# Patient Record
Sex: Male | Born: 1970 | Race: White | Marital: Married
Health system: Southern US, Community
[De-identification: ages and names within clinical notes are randomized; demographics above are authoritative.]

---

## 2016-10-16 DIAGNOSIS — J028 Acute pharyngitis due to other specified organisms: Secondary | ICD-10-CM | POA: Diagnosis not present

## 2016-10-16 DIAGNOSIS — J029 Acute pharyngitis, unspecified: Secondary | ICD-10-CM | POA: Diagnosis not present

## 2016-11-15 DIAGNOSIS — Z0000 Encounter for general adult medical examination without abnormal findings: Secondary | ICD-10-CM | POA: Diagnosis not present

## 2016-11-15 DIAGNOSIS — E559 Vitamin D deficiency, unspecified: Secondary | ICD-10-CM | POA: Diagnosis not present

## 2016-11-15 DIAGNOSIS — M8580 Other specified disorders of bone density and structure, unspecified site: Secondary | ICD-10-CM | POA: Diagnosis not present

## 2016-11-15 DIAGNOSIS — Z125 Encounter for screening for malignant neoplasm of prostate: Secondary | ICD-10-CM | POA: Diagnosis not present

## 2016-11-15 DIAGNOSIS — E291 Testicular hypofunction: Secondary | ICD-10-CM | POA: Diagnosis not present

## 2016-11-15 DIAGNOSIS — E782 Mixed hyperlipidemia: Secondary | ICD-10-CM | POA: Diagnosis not present

## 2017-02-14 DIAGNOSIS — E291 Testicular hypofunction: Secondary | ICD-10-CM | POA: Diagnosis not present

## 2017-02-14 DIAGNOSIS — D225 Melanocytic nevi of trunk: Secondary | ICD-10-CM | POA: Diagnosis not present

## 2017-02-14 DIAGNOSIS — Z5181 Encounter for therapeutic drug level monitoring: Secondary | ICD-10-CM | POA: Diagnosis not present

## 2017-02-14 DIAGNOSIS — L814 Other melanin hyperpigmentation: Secondary | ICD-10-CM | POA: Diagnosis not present

## 2017-02-14 DIAGNOSIS — D1801 Hemangioma of skin and subcutaneous tissue: Secondary | ICD-10-CM | POA: Diagnosis not present

## 2017-02-14 DIAGNOSIS — R5383 Other fatigue: Secondary | ICD-10-CM | POA: Diagnosis not present

## 2017-02-16 DIAGNOSIS — R5383 Other fatigue: Secondary | ICD-10-CM | POA: Diagnosis not present

## 2017-02-16 DIAGNOSIS — Z5181 Encounter for therapeutic drug level monitoring: Secondary | ICD-10-CM | POA: Diagnosis not present

## 2017-02-16 DIAGNOSIS — Z23 Encounter for immunization: Secondary | ICD-10-CM | POA: Diagnosis not present

## 2017-02-16 DIAGNOSIS — E291 Testicular hypofunction: Secondary | ICD-10-CM | POA: Diagnosis not present

## 2017-02-24 ENCOUNTER — Other Ambulatory Visit: Payer: Self-pay | Admitting: Internal Medicine

## 2017-02-24 DIAGNOSIS — E230 Hypopituitarism: Secondary | ICD-10-CM

## 2017-03-06 ENCOUNTER — Other Ambulatory Visit: Payer: Self-pay

## 2017-03-09 ENCOUNTER — Ambulatory Visit
Admission: RE | Admit: 2017-03-09 | Discharge: 2017-03-09 | Disposition: A | Discharge status: Home / Self Care | Payer: 59 | Source: Ambulatory Visit | Attending: Internal Medicine | Admitting: Internal Medicine

## 2017-03-09 DIAGNOSIS — E230 Hypopituitarism: Secondary | ICD-10-CM

## 2017-03-09 MED ORDER — GADOBENATE DIMEGLUMINE 529 MG/ML IV SOLN
10.0000 mL | Freq: Once | INTRAVENOUS | Status: AC | PRN
  Administered 2017-03-09: 10 mL via INTRAVENOUS

## 2017-03-28 DIAGNOSIS — R5383 Other fatigue: Secondary | ICD-10-CM | POA: Diagnosis not present

## 2017-03-28 DIAGNOSIS — E230 Hypopituitarism: Secondary | ICD-10-CM | POA: Diagnosis not present

## 2017-03-28 DIAGNOSIS — Z5181 Encounter for therapeutic drug level monitoring: Secondary | ICD-10-CM | POA: Diagnosis not present

## 2017-04-28 DIAGNOSIS — H1851 Endothelial corneal dystrophy: Secondary | ICD-10-CM | POA: Diagnosis not present

## 2017-05-30 DIAGNOSIS — E782 Mixed hyperlipidemia: Secondary | ICD-10-CM | POA: Diagnosis not present

## 2017-05-30 DIAGNOSIS — E559 Vitamin D deficiency, unspecified: Secondary | ICD-10-CM | POA: Diagnosis not present

## 2017-05-30 DIAGNOSIS — J309 Allergic rhinitis, unspecified: Secondary | ICD-10-CM | POA: Diagnosis not present

## 2017-09-26 DIAGNOSIS — R5383 Other fatigue: Secondary | ICD-10-CM | POA: Diagnosis not present

## 2017-09-26 DIAGNOSIS — Z125 Encounter for screening for malignant neoplasm of prostate: Secondary | ICD-10-CM | POA: Diagnosis not present

## 2017-09-26 DIAGNOSIS — Z5181 Encounter for therapeutic drug level monitoring: Secondary | ICD-10-CM | POA: Diagnosis not present

## 2017-09-26 DIAGNOSIS — E230 Hypopituitarism: Secondary | ICD-10-CM | POA: Diagnosis not present

## 2017-11-29 DIAGNOSIS — E291 Testicular hypofunction: Secondary | ICD-10-CM | POA: Diagnosis not present

## 2017-11-29 DIAGNOSIS — E782 Mixed hyperlipidemia: Secondary | ICD-10-CM | POA: Diagnosis not present

## 2017-11-29 DIAGNOSIS — E559 Vitamin D deficiency, unspecified: Secondary | ICD-10-CM | POA: Diagnosis not present

## 2017-11-29 DIAGNOSIS — M85852 Other specified disorders of bone density and structure, left thigh: Secondary | ICD-10-CM | POA: Diagnosis not present

## 2018-01-11 DIAGNOSIS — M8588 Other specified disorders of bone density and structure, other site: Secondary | ICD-10-CM | POA: Diagnosis not present

## 2018-02-15 DIAGNOSIS — L570 Actinic keratosis: Secondary | ICD-10-CM | POA: Diagnosis not present

## 2018-02-15 DIAGNOSIS — D1801 Hemangioma of skin and subcutaneous tissue: Secondary | ICD-10-CM | POA: Diagnosis not present

## 2018-02-15 DIAGNOSIS — L814 Other melanin hyperpigmentation: Secondary | ICD-10-CM | POA: Diagnosis not present

## 2018-03-29 DIAGNOSIS — Z5181 Encounter for therapeutic drug level monitoring: Secondary | ICD-10-CM | POA: Diagnosis not present

## 2018-03-29 DIAGNOSIS — E230 Hypopituitarism: Secondary | ICD-10-CM | POA: Diagnosis not present

## 2018-03-29 DIAGNOSIS — E669 Obesity, unspecified: Secondary | ICD-10-CM | POA: Diagnosis not present

## 2018-05-01 DIAGNOSIS — H1851 Endothelial corneal dystrophy: Secondary | ICD-10-CM | POA: Diagnosis not present

## 2018-06-05 DIAGNOSIS — E559 Vitamin D deficiency, unspecified: Secondary | ICD-10-CM | POA: Diagnosis not present

## 2018-06-05 DIAGNOSIS — M85852 Other specified disorders of bone density and structure, left thigh: Secondary | ICD-10-CM | POA: Diagnosis not present

## 2018-06-05 DIAGNOSIS — E782 Mixed hyperlipidemia: Secondary | ICD-10-CM | POA: Diagnosis not present

## 2018-10-04 DIAGNOSIS — E230 Hypopituitarism: Secondary | ICD-10-CM | POA: Diagnosis not present

## 2018-10-04 DIAGNOSIS — Z119 Encounter for screening for infectious and parasitic diseases, unspecified: Secondary | ICD-10-CM | POA: Diagnosis not present

## 2018-10-04 DIAGNOSIS — Z13220 Encounter for screening for lipoid disorders: Secondary | ICD-10-CM | POA: Diagnosis not present

## 2018-10-11 DIAGNOSIS — E669 Obesity, unspecified: Secondary | ICD-10-CM | POA: Diagnosis not present

## 2018-10-11 DIAGNOSIS — Z5181 Encounter for therapeutic drug level monitoring: Secondary | ICD-10-CM | POA: Diagnosis not present

## 2018-10-11 DIAGNOSIS — E230 Hypopituitarism: Secondary | ICD-10-CM | POA: Diagnosis not present

## 2019-02-21 DIAGNOSIS — D225 Melanocytic nevi of trunk: Secondary | ICD-10-CM | POA: Diagnosis not present

## 2019-02-21 DIAGNOSIS — L814 Other melanin hyperpigmentation: Secondary | ICD-10-CM | POA: Diagnosis not present

## 2019-03-27 IMAGING — MR MR HEAD WO/W CM
24 series · 48 of 48 positions shown · IV contrast (Multihance 10ml)
Comparison: None.

CLINICAL DATA: Hypogonadotropic hypogonadism (HCC)
([id number])

EXAM:
MRI HEAD WITHOUT AND WITH CONTRAST
TECHNIQUE: Multiplanar, multiecho pulse sequences of the brain and surrounding
structures were obtained without and with intravenous contrast.
CONTRAST:  10mL MULTIHANCE GADOBENATE DIMEGLUMINE 529 MG/ML IV SOLN

[Series 5: T1 · sagittal · 4.0mm · 0.72mm/px · 1 of 27 slices shown (1 of 5)]
[im 1/27]
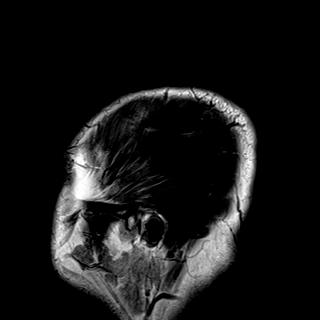

[Series 6: DWI · axial · 3.0mm · 1.44mm/px · z∈[-71,+89]mm · 6 of 84 slices shown (1 of 2)]
[im 1/84]
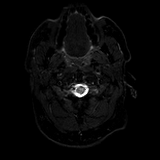
[im 17/84]
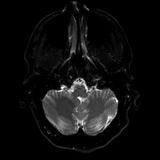
[im 34/84]
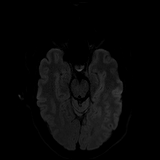
[im 50/84]
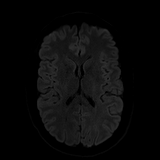
[im 67/84]
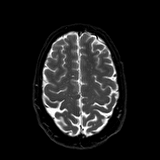
[im 84/84]
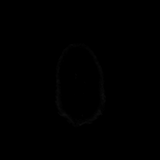

[Series 7: DWI · axial · 3.0mm · 1.44mm/px · z∈[-71,+89]mm · 2 of 42 slices shown (2 of 2)]
[im 1/42]
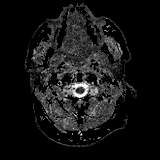
[im 42/42]
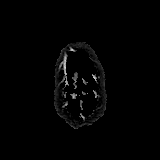

[Series 8: T2 · axial · 4.0mm · 0.36mm/px · z∈[-64,+92]mm · 2 of 31 slices shown]
[im 1/31]
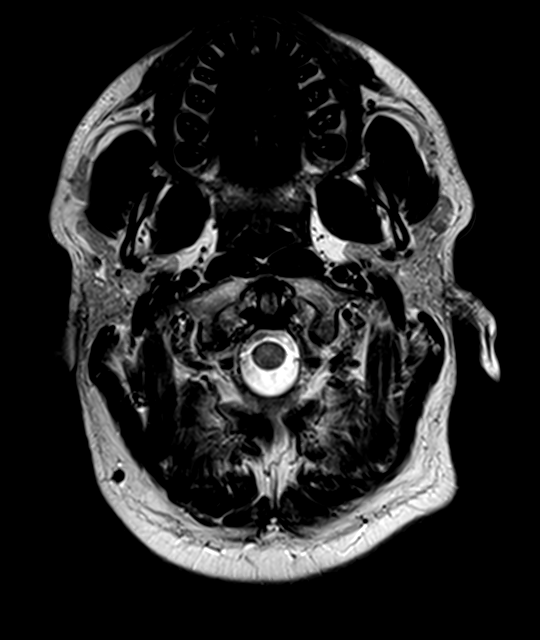
[im 31/31]
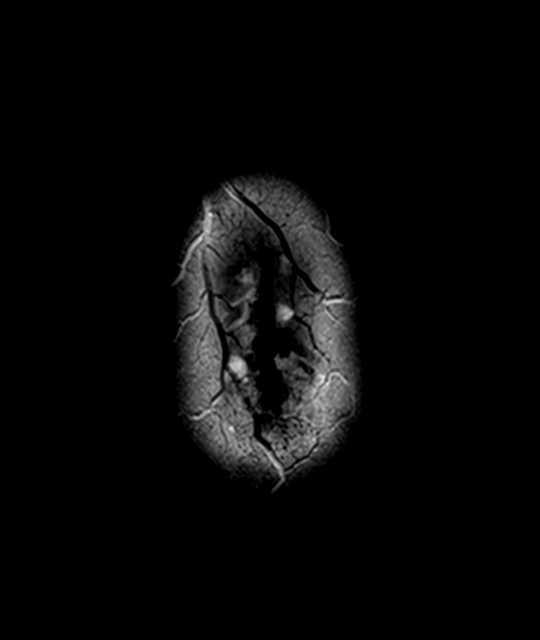

[Series 9: GRE · axial · 4.0mm · 0.69mm/px · z∈[-65,+91]mm · 2 of 31 slices shown]
[im 1/31]
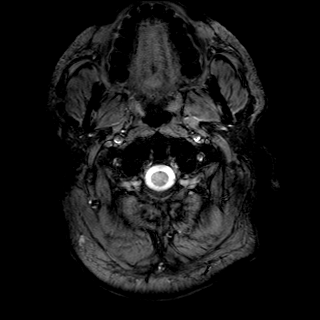
[im 31/31]
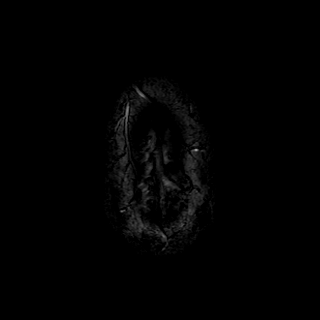

[Series 10: FLAIR · axial · 3.0mm · 0.72mm/px · z∈[-59,+85]mm · 2 of 25 slices shown]
[im 1/25]
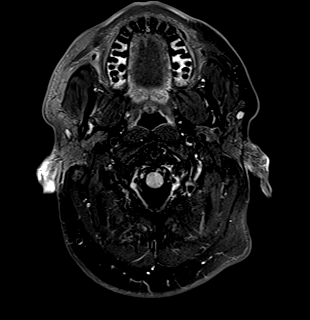
[im 25/25]
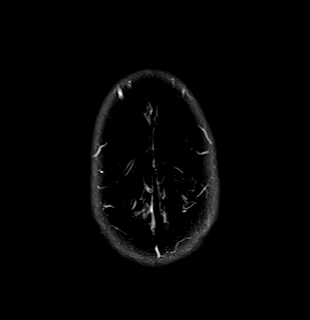

[Series 11: T1 · sagittal · 3.0mm · 0.42mm/px · 1 of 13 slices shown (2 of 5)]
[im 1/13]
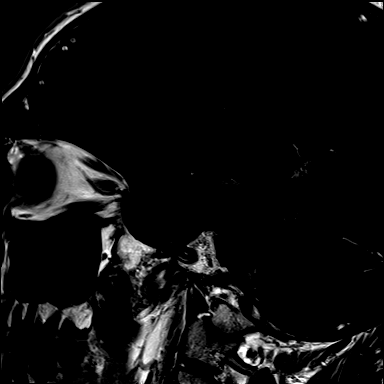

[Series 12: T1 · coronal · 3.0mm · 0.42mm/px · 1 of 10 slices shown (3 of 5)]
[im 1/10]
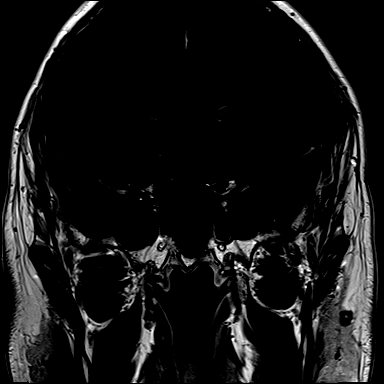

[Series 13: T1 · coronal · non-contrast · 3.0mm · 0.62mm/px · 1 of 9 slices shown (4 of 5)]
[im 1/9]
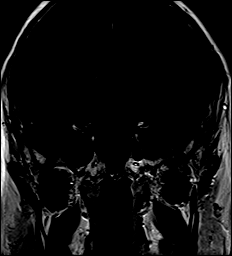

[Series 14: cor post dyn · coronal · 3.0mm · 0.62mm/px · 1 of 9 slices shown (1 of 6)]
[im 1/9]
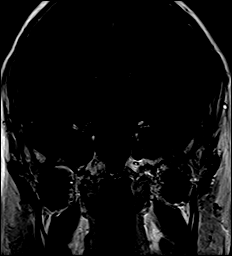

[Series 15: cor post dyn · coronal · 3.0mm · 0.62mm/px · 1 of 9 slices shown (2 of 6)]
[im 1/9]
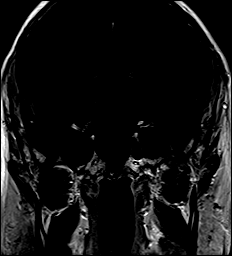

[Series 16: cor post dyn · coronal · 3.0mm · 0.62mm/px · 1 of 9 slices shown (3 of 6)]
[im 1/9]
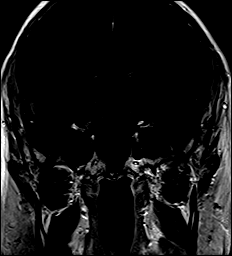

[Series 17: cor post dyn · coronal · 3.0mm · 0.62mm/px · 1 of 9 slices shown (4 of 6)]
[im 1/9]
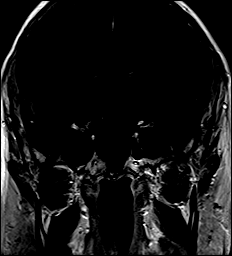

[Series 18: cor post dyn · coronal · 3.0mm · 0.62mm/px · 1 of 9 slices shown (5 of 6)]
[im 1/9]
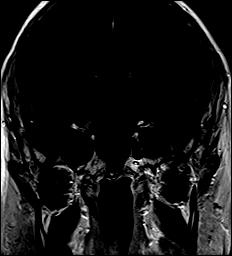

[Series 19: cor post dyn · coronal · 3.0mm · 0.62mm/px · 1 of 9 slices shown (6 of 6)]
[im 1/9]
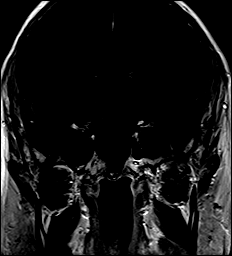

[Series 20: T1 post-contrast · sagittal · 3.0mm · 0.42mm/px · 1 of 13 slices shown (1 of 3)]
[im 1/13]
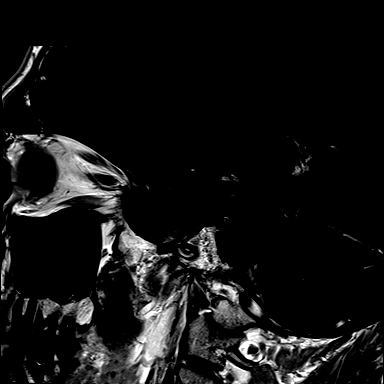

[Series 21: T1 post-contrast · coronal · 3.0mm · 0.42mm/px · 1 of 10 slices shown (2 of 3)]
[im 1/10]
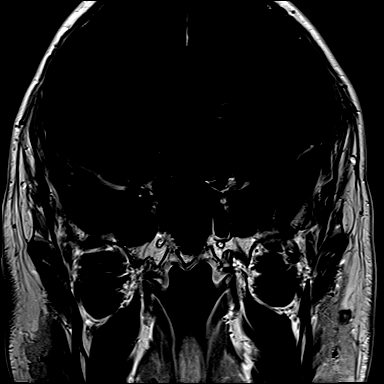

[Series 22: T1 · axial · 1.0mm · 0.86mm/px · z∈[-72,+102]mm · 14 of 176 slices shown (5 of 5)]
[im 1/176]
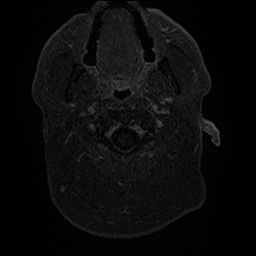
[im 14/176]
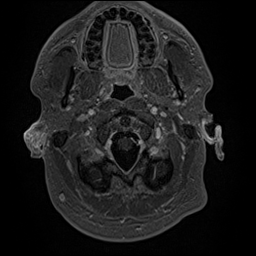
[im 27/176]
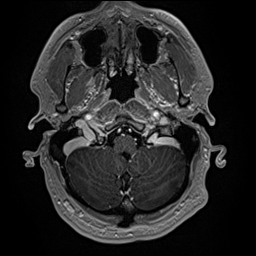
[im 41/176]
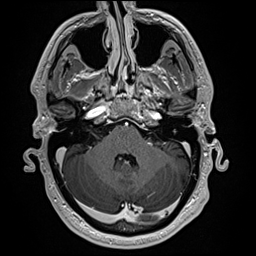
[im 54/176]
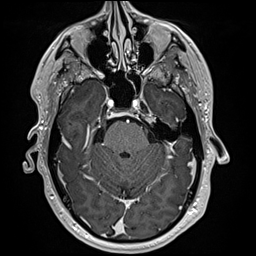
[im 68/176]
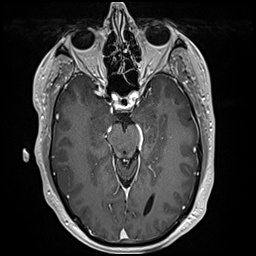
[im 81/176]
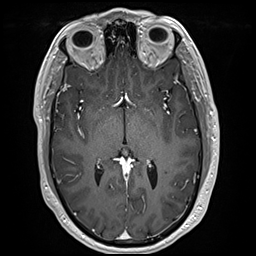
[im 95/176]
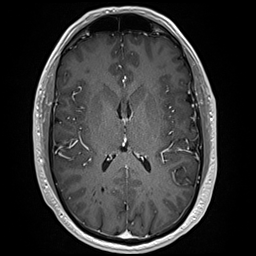
[im 108/176]
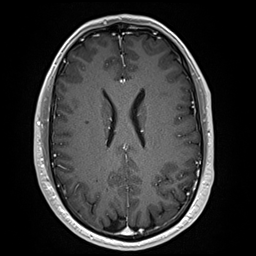
[im 122/176]
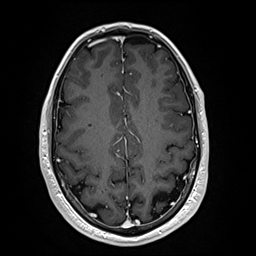
[im 135/176]
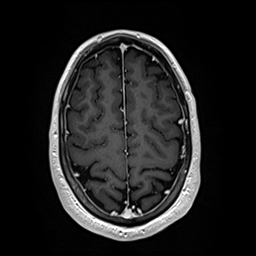
[im 149/176]
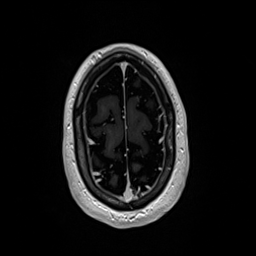
[im 162/176]
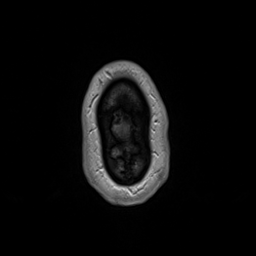
[im 176/176]
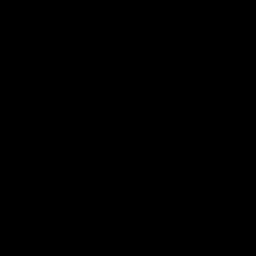

[Series 23: T1 post-contrast · coronal · 4.0mm · 0.47mm/px · 3 of 33 slices shown (3 of 3)]
[im 1/33]
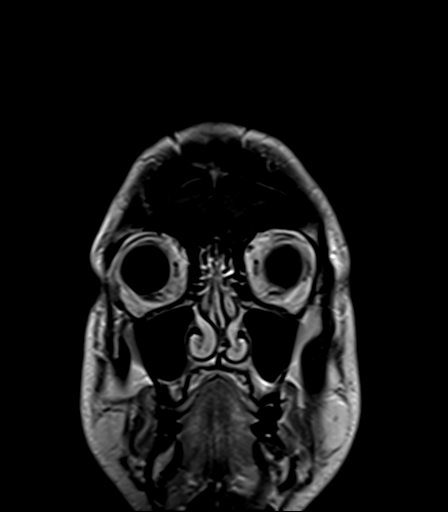
[im 17/33]
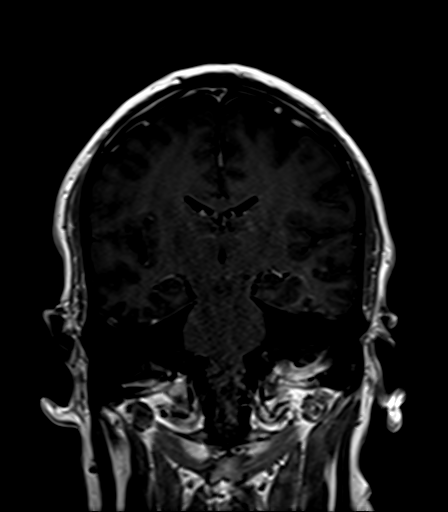
[im 33/33]
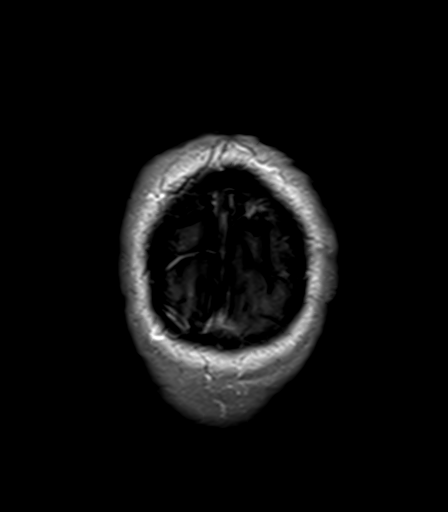

[Series 24: t1_vibe_cor_dynamic · coronal · 2.0mm · 0.50mm/px · 1 of 12 slices shown (1 of 5)]
[im 1/12]
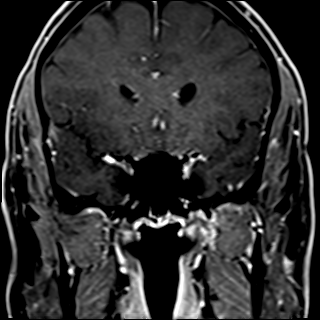

[Series 25: t1_vibe_cor_dynamic · coronal · 2.0mm · 0.50mm/px · 1 of 12 slices shown (2 of 5)]
[im 1/12]
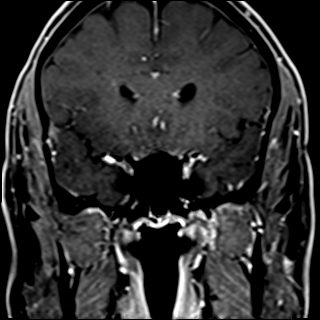

[Series 26: t1_vibe_cor_dynamic · coronal · 2.0mm · 0.50mm/px · 1 of 12 slices shown (3 of 5)]
[im 1/12]
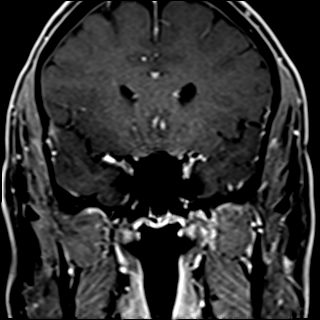

[Series 27: t1_vibe_cor_dynamic · coronal · 2.0mm · 0.50mm/px · 1 of 12 slices shown (4 of 5)]
[im 1/12]
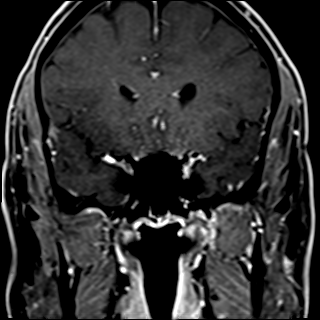

[Series 28: t1_vibe_cor_dynamic · coronal · 2.0mm · 0.50mm/px · 1 of 12 slices shown (5 of 5)]
[im 1/12]
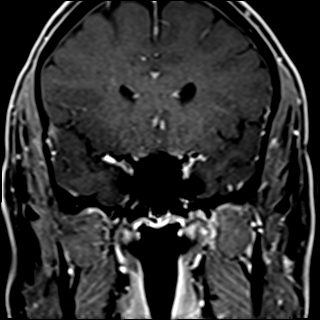

[48 of 48 positions shown; findings below may reference images not displayed]

FINDINGS: Brain: No acute infarction, hemorrhage, hydrocephalus, extra-axial
collection or mass lesion. Normal for age cerebral volume. No
significant white matter disease.

Post infusion, no abnormal enhancement of brain or meninges.

Thin-section imaging was performed before, during, and after bolus
infusion of contrast with special attention to the sella turcica.
Normal gland height and enhancement. Posterior pituitary bright spot
is maintained. Stalk is intact, and midline. No cavernous sinus or
suprasellar lesion. Optic chiasm normally positioned. No focal areas
of hypoenhancement or contour irregularity.

Vascular: Flow voids are maintained throughout the carotid, basilar,
and vertebral arteries. There are no areas of chronic hemorrhage.

Skull and upper cervical spine: Unremarkable visualized calvarium,
skullbase, and cervical vertebrae. Pineal and cerebellar tonsils
unremarkable. No upper cervical cord lesions.

Sinuses/Orbits: Mild chronic sinus disease.  Negative orbits.

Other: None.
IMPRESSION: Negative MRI of the brain with special attention to the pituitary.
No evidence for micro- or macroadenoma. No acute intracranial
abnormality.

## 2019-05-03 DIAGNOSIS — H5203 Hypermetropia, bilateral: Secondary | ICD-10-CM | POA: Diagnosis not present

## 2019-05-03 DIAGNOSIS — H18513 Endothelial corneal dystrophy, bilateral: Secondary | ICD-10-CM | POA: Diagnosis not present

## 2019-05-03 DIAGNOSIS — H524 Presbyopia: Secondary | ICD-10-CM | POA: Diagnosis not present

## 2019-06-29 DIAGNOSIS — E782 Mixed hyperlipidemia: Secondary | ICD-10-CM | POA: Diagnosis not present

## 2019-06-29 DIAGNOSIS — Z0000 Encounter for general adult medical examination without abnormal findings: Secondary | ICD-10-CM | POA: Diagnosis not present

## 2019-06-29 DIAGNOSIS — E559 Vitamin D deficiency, unspecified: Secondary | ICD-10-CM | POA: Diagnosis not present

## 2019-06-29 DIAGNOSIS — M85852 Other specified disorders of bone density and structure, left thigh: Secondary | ICD-10-CM | POA: Diagnosis not present

## 2019-06-29 DIAGNOSIS — E230 Hypopituitarism: Secondary | ICD-10-CM | POA: Diagnosis not present

## 2019-06-29 DIAGNOSIS — J309 Allergic rhinitis, unspecified: Secondary | ICD-10-CM | POA: Diagnosis not present

## 2020-02-21 DIAGNOSIS — L578 Other skin changes due to chronic exposure to nonionizing radiation: Secondary | ICD-10-CM | POA: Diagnosis not present

## 2020-02-21 DIAGNOSIS — L814 Other melanin hyperpigmentation: Secondary | ICD-10-CM | POA: Diagnosis not present

## 2020-02-21 DIAGNOSIS — D225 Melanocytic nevi of trunk: Secondary | ICD-10-CM | POA: Diagnosis not present

## 2020-05-07 DIAGNOSIS — H524 Presbyopia: Secondary | ICD-10-CM | POA: Diagnosis not present

## 2020-05-07 DIAGNOSIS — H52203 Unspecified astigmatism, bilateral: Secondary | ICD-10-CM | POA: Diagnosis not present

## 2020-05-07 DIAGNOSIS — H53002 Unspecified amblyopia, left eye: Secondary | ICD-10-CM | POA: Diagnosis not present

## 2020-05-07 DIAGNOSIS — H18513 Endothelial corneal dystrophy, bilateral: Secondary | ICD-10-CM | POA: Diagnosis not present

## 2020-07-01 DIAGNOSIS — E559 Vitamin D deficiency, unspecified: Secondary | ICD-10-CM | POA: Diagnosis not present

## 2020-07-01 DIAGNOSIS — Z0000 Encounter for general adult medical examination without abnormal findings: Secondary | ICD-10-CM | POA: Diagnosis not present

## 2020-07-01 DIAGNOSIS — E230 Hypopituitarism: Secondary | ICD-10-CM | POA: Diagnosis not present

## 2020-07-01 DIAGNOSIS — E782 Mixed hyperlipidemia: Secondary | ICD-10-CM | POA: Diagnosis not present

## 2020-07-01 DIAGNOSIS — M85852 Other specified disorders of bone density and structure, left thigh: Secondary | ICD-10-CM | POA: Diagnosis not present

## 2020-07-02 ENCOUNTER — Other Ambulatory Visit: Payer: Self-pay | Admitting: Family Medicine

## 2020-07-02 DIAGNOSIS — M8580 Other specified disorders of bone density and structure, unspecified site: Secondary | ICD-10-CM

## 2020-09-16 DIAGNOSIS — Z01818 Encounter for other preprocedural examination: Secondary | ICD-10-CM | POA: Diagnosis not present

## 2020-12-09 ENCOUNTER — Other Ambulatory Visit: Payer: Self-pay

## 2020-12-09 ENCOUNTER — Ambulatory Visit
Admission: RE | Admit: 2020-12-09 | Discharge: 2020-12-09 | Disposition: A | Discharge status: Home / Self Care | Payer: BC Managed Care – PPO | Source: Ambulatory Visit | Attending: Family Medicine | Admitting: Family Medicine

## 2020-12-09 DIAGNOSIS — M8580 Other specified disorders of bone density and structure, unspecified site: Secondary | ICD-10-CM

## 2020-12-09 DIAGNOSIS — M85852 Other specified disorders of bone density and structure, left thigh: Secondary | ICD-10-CM | POA: Diagnosis not present

## 2021-02-11 DIAGNOSIS — L739 Follicular disorder, unspecified: Secondary | ICD-10-CM | POA: Diagnosis not present

## 2021-02-11 DIAGNOSIS — D225 Melanocytic nevi of trunk: Secondary | ICD-10-CM | POA: Diagnosis not present

## 2021-02-11 DIAGNOSIS — L578 Other skin changes due to chronic exposure to nonionizing radiation: Secondary | ICD-10-CM | POA: Diagnosis not present

## 2021-02-11 DIAGNOSIS — L814 Other melanin hyperpigmentation: Secondary | ICD-10-CM | POA: Diagnosis not present

## 2021-02-16 DIAGNOSIS — S0502XA Injury of conjunctiva and corneal abrasion without foreign body, left eye, initial encounter: Secondary | ICD-10-CM | POA: Diagnosis not present

## 2021-02-17 DIAGNOSIS — S0502XD Injury of conjunctiva and corneal abrasion without foreign body, left eye, subsequent encounter: Secondary | ICD-10-CM | POA: Diagnosis not present

## 2021-05-08 DIAGNOSIS — H18513 Endothelial corneal dystrophy, bilateral: Secondary | ICD-10-CM | POA: Diagnosis not present

## 2021-05-08 DIAGNOSIS — H53002 Unspecified amblyopia, left eye: Secondary | ICD-10-CM | POA: Diagnosis not present

## 2021-05-08 DIAGNOSIS — H5203 Hypermetropia, bilateral: Secondary | ICD-10-CM | POA: Diagnosis not present

## 2021-08-03 DIAGNOSIS — E559 Vitamin D deficiency, unspecified: Secondary | ICD-10-CM | POA: Diagnosis not present

## 2021-08-03 DIAGNOSIS — Z23 Encounter for immunization: Secondary | ICD-10-CM | POA: Diagnosis not present

## 2021-08-03 DIAGNOSIS — E782 Mixed hyperlipidemia: Secondary | ICD-10-CM | POA: Diagnosis not present

## 2021-08-03 DIAGNOSIS — Z0000 Encounter for general adult medical examination without abnormal findings: Secondary | ICD-10-CM | POA: Diagnosis not present

## 2021-08-03 DIAGNOSIS — E230 Hypopituitarism: Secondary | ICD-10-CM | POA: Diagnosis not present

## 2021-09-12 ENCOUNTER — Telehealth: Payer: BC Managed Care – PPO | Admitting: Nurse Practitioner

## 2021-09-12 DIAGNOSIS — B9689 Other specified bacterial agents as the cause of diseases classified elsewhere: Secondary | ICD-10-CM

## 2021-09-12 DIAGNOSIS — J329 Chronic sinusitis, unspecified: Secondary | ICD-10-CM

## 2021-09-12 MED ORDER — AMOXICILLIN-POT CLAVULANATE 875-125 MG PO TABS: 1.0000 | ORAL_TABLET | Freq: Two times a day (BID) | ORAL | 0 refills | Status: AC

## 2021-09-12 NOTE — Progress Notes (Signed)
I have spent 5 minutes in review of e-visit questionnaire, review and updating patient chart, medical decision making and response to patient.  ° °Kerly Rigsbee W Teena Mangus, NP ° °  °

## 2021-09-12 NOTE — Progress Notes (Signed)

## 2021-10-15 DIAGNOSIS — E230 Hypopituitarism: Secondary | ICD-10-CM | POA: Diagnosis not present

## 2022-01-27 DIAGNOSIS — Z1211 Encounter for screening for malignant neoplasm of colon: Secondary | ICD-10-CM | POA: Diagnosis not present

## 2022-01-27 DIAGNOSIS — K5730 Diverticulosis of large intestine without perforation or abscess without bleeding: Secondary | ICD-10-CM | POA: Diagnosis not present

## 2022-01-27 DIAGNOSIS — K648 Other hemorrhoids: Secondary | ICD-10-CM | POA: Diagnosis not present

## 2022-01-27 DIAGNOSIS — D125 Benign neoplasm of sigmoid colon: Secondary | ICD-10-CM | POA: Diagnosis not present

## 2022-02-23 DIAGNOSIS — L578 Other skin changes due to chronic exposure to nonionizing radiation: Secondary | ICD-10-CM | POA: Diagnosis not present

## 2022-02-23 DIAGNOSIS — D225 Melanocytic nevi of trunk: Secondary | ICD-10-CM | POA: Diagnosis not present

## 2022-02-23 DIAGNOSIS — L814 Other melanin hyperpigmentation: Secondary | ICD-10-CM | POA: Diagnosis not present

## 2022-05-13 DIAGNOSIS — H5203 Hypermetropia, bilateral: Secondary | ICD-10-CM | POA: Diagnosis not present

## 2022-05-13 DIAGNOSIS — H52203 Unspecified astigmatism, bilateral: Secondary | ICD-10-CM | POA: Diagnosis not present

## 2022-05-13 DIAGNOSIS — H18513 Endothelial corneal dystrophy, bilateral: Secondary | ICD-10-CM | POA: Diagnosis not present

## 2022-09-21 DIAGNOSIS — Z0000 Encounter for general adult medical examination without abnormal findings: Secondary | ICD-10-CM | POA: Diagnosis not present

## 2022-09-21 DIAGNOSIS — E559 Vitamin D deficiency, unspecified: Secondary | ICD-10-CM | POA: Diagnosis not present

## 2022-09-21 DIAGNOSIS — Z125 Encounter for screening for malignant neoplasm of prostate: Secondary | ICD-10-CM | POA: Diagnosis not present

## 2022-09-21 DIAGNOSIS — E230 Hypopituitarism: Secondary | ICD-10-CM | POA: Diagnosis not present

## 2022-09-21 DIAGNOSIS — E782 Mixed hyperlipidemia: Secondary | ICD-10-CM | POA: Diagnosis not present

## 2022-09-21 DIAGNOSIS — M85852 Other specified disorders of bone density and structure, left thigh: Secondary | ICD-10-CM | POA: Diagnosis not present

## 2023-03-14 DIAGNOSIS — I872 Venous insufficiency (chronic) (peripheral): Secondary | ICD-10-CM | POA: Diagnosis not present

## 2023-03-14 DIAGNOSIS — D225 Melanocytic nevi of trunk: Secondary | ICD-10-CM | POA: Diagnosis not present

## 2023-03-14 DIAGNOSIS — L814 Other melanin hyperpigmentation: Secondary | ICD-10-CM | POA: Diagnosis not present

## 2023-03-14 DIAGNOSIS — L578 Other skin changes due to chronic exposure to nonionizing radiation: Secondary | ICD-10-CM | POA: Diagnosis not present

## 2023-05-13 DIAGNOSIS — H5203 Hypermetropia, bilateral: Secondary | ICD-10-CM | POA: Diagnosis not present

## 2023-05-13 DIAGNOSIS — H18513 Endothelial corneal dystrophy, bilateral: Secondary | ICD-10-CM | POA: Diagnosis not present

## 2023-05-13 DIAGNOSIS — H524 Presbyopia: Secondary | ICD-10-CM | POA: Diagnosis not present

## 2023-09-08 DIAGNOSIS — M654 Radial styloid tenosynovitis [de Quervain]: Secondary | ICD-10-CM | POA: Diagnosis not present

## 2023-10-25 DIAGNOSIS — M25662 Stiffness of left knee, not elsewhere classified: Secondary | ICD-10-CM | POA: Diagnosis not present

## 2023-10-25 DIAGNOSIS — M85852 Other specified disorders of bone density and structure, left thigh: Secondary | ICD-10-CM | POA: Diagnosis not present

## 2023-10-25 DIAGNOSIS — Z125 Encounter for screening for malignant neoplasm of prostate: Secondary | ICD-10-CM | POA: Diagnosis not present

## 2023-10-25 DIAGNOSIS — E782 Mixed hyperlipidemia: Secondary | ICD-10-CM | POA: Diagnosis not present

## 2023-10-25 DIAGNOSIS — E291 Testicular hypofunction: Secondary | ICD-10-CM | POA: Diagnosis not present

## 2023-10-25 DIAGNOSIS — Z0000 Encounter for general adult medical examination without abnormal findings: Secondary | ICD-10-CM | POA: Diagnosis not present

## 2023-10-25 DIAGNOSIS — E559 Vitamin D deficiency, unspecified: Secondary | ICD-10-CM | POA: Diagnosis not present

## 2023-10-25 DIAGNOSIS — E230 Hypopituitarism: Secondary | ICD-10-CM | POA: Diagnosis not present

## 2023-11-03 DIAGNOSIS — M8588 Other specified disorders of bone density and structure, other site: Secondary | ICD-10-CM | POA: Diagnosis not present

## 2024-04-09 DIAGNOSIS — L814 Other melanin hyperpigmentation: Secondary | ICD-10-CM | POA: Diagnosis not present

## 2024-04-09 DIAGNOSIS — D225 Melanocytic nevi of trunk: Secondary | ICD-10-CM | POA: Diagnosis not present

## 2024-04-09 DIAGNOSIS — I872 Venous insufficiency (chronic) (peripheral): Secondary | ICD-10-CM | POA: Diagnosis not present

## 2024-04-09 DIAGNOSIS — L578 Other skin changes due to chronic exposure to nonionizing radiation: Secondary | ICD-10-CM | POA: Diagnosis not present
